# Patient Record
Sex: Male | Born: 1990 | Race: Black or African American | Hispanic: No | Marital: Single | Smoking: Current every day smoker
Health system: Southern US, Community
[De-identification: ages and names within clinical notes are randomized; demographics above are authoritative.]

---
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
## 2008-03-24 ENCOUNTER — Emergency Department (HOSPITAL_COMMUNITY): Admission: EM | Admit: 2008-03-24 | Discharge: 2008-03-25 | Payer: Self-pay | Admitting: Emergency Medicine

## 2010-04-07 ENCOUNTER — Emergency Department (HOSPITAL_COMMUNITY): Admission: EM | Admit: 2010-04-07 | Discharge: 2010-04-07 | Payer: Self-pay | Admitting: Emergency Medicine

## 2011-08-06 LAB — URINALYSIS, ROUTINE W REFLEX MICROSCOPIC
Bilirubin Urine: NEGATIVE
Glucose, UA: NEGATIVE
Hgb urine dipstick: NEGATIVE
Ketones, ur: NEGATIVE
Nitrite: NEGATIVE
Protein, ur: NEGATIVE
Specific Gravity, Urine: 1.02
Urobilinogen, UA: 0.2
pH: 6.5

## 2021-10-13 ENCOUNTER — Emergency Department (HOSPITAL_COMMUNITY): Payer: Self-pay

## 2021-10-13 ENCOUNTER — Emergency Department (HOSPITAL_COMMUNITY)
Admission: EM | Admit: 2021-10-13 | Discharge: 2021-10-14 | Disposition: A | Discharge status: Home / Self Care | Payer: Self-pay | Attending: Emergency Medicine | Admitting: Emergency Medicine

## 2021-10-13 ENCOUNTER — Encounter (HOSPITAL_COMMUNITY): Payer: Self-pay | Admitting: *Deleted

## 2021-10-13 DIAGNOSIS — R059 Cough, unspecified: Secondary | ICD-10-CM | POA: Insufficient documentation

## 2021-10-13 DIAGNOSIS — R0789 Other chest pain: Secondary | ICD-10-CM | POA: Insufficient documentation

## 2021-10-13 DIAGNOSIS — F17210 Nicotine dependence, cigarettes, uncomplicated: Secondary | ICD-10-CM | POA: Insufficient documentation

## 2021-10-13 DIAGNOSIS — R0602 Shortness of breath: Secondary | ICD-10-CM | POA: Insufficient documentation

## 2021-10-13 NOTE — ED Triage Notes (Signed)
Chest pain x1 week

## 2021-10-14 LAB — COMPREHENSIVE METABOLIC PANEL
ALT: 30 U/L (ref 0–44)
AST: 16 U/L (ref 15–41)
Albumin: 3.9 g/dL (ref 3.5–5.0)
Alkaline Phosphatase: 81 U/L (ref 38–126)
Anion gap: 6 (ref 5–15)
BUN: 13 mg/dL (ref 6–20)
CO2: 27 mmol/L (ref 22–32)
Calcium: 8.8 mg/dL — ABNORMAL LOW (ref 8.9–10.3)
Chloride: 105 mmol/L (ref 98–111)
Creatinine, Ser: 1.18 mg/dL (ref 0.61–1.24)
GFR, Estimated: >60 mL/min (ref >60)
Glucose, Bld: 96 mg/dL (ref 70–99)
Potassium: 4.2 mmol/L (ref 3.5–5.1)
Sodium: 138 mmol/L (ref 135–145)
Total Bilirubin: 0.2 mg/dL — ABNORMAL LOW (ref 0.3–1.2)
Total Protein: 7.7 g/dL (ref 6.5–8.1)

## 2021-10-14 LAB — CBC
HCT: 45 % (ref 39.0–52.0)
Hemoglobin: 14.6 g/dL (ref 13.0–17.0)
MCH: 29 pg (ref 26.0–34.0)
MCHC: 32.4 g/dL (ref 30.0–36.0)
MCV: 89.5 fL (ref 80.0–100.0)
Platelets: 319 10*3/uL (ref 150–400)
RBC: 5.03 MIL/uL (ref 4.22–5.81)
RDW: 13.5 % (ref 11.5–15.5)
WBC: 11.1 10*3/uL — ABNORMAL HIGH (ref 4.0–10.5)
nRBC: 0 % (ref 0.0–0.2)

## 2021-10-14 LAB — TROPONIN I (HIGH SENSITIVITY): Troponin I (High Sensitivity): 2 ng/L (ref <18)

## 2021-10-14 MED ORDER — ALBUTEROL SULFATE HFA 108 (90 BASE) MCG/ACT IN AERS: 2.0000 | INHALATION_SPRAY | RESPIRATORY_TRACT | Status: DC | PRN

## 2021-10-14 NOTE — ED Provider Notes (Signed)
[facility] EMERGENCY DEPARTMENT Provider Note   CSN: [id number] Arrival date & time: 10/13/21  1742     History Chief Complaint  Patient presents with   Chest Pain    Chase Larson is a 30 y.o. male.  Patient presents to the emergency department for evaluation of chest pain.  Patient reports that the pain began 1 week ago.  Initially the pain was constant but now it seems to be more intermittent.  He does have some slight cough and has had occasional intermittent shortness of breath.  He has a smoker, is trying to cut back.  No hypertension, high cholesterol, diabetes, family history of early heart disease.   HPI: A 30 year old patient with a history of obesity presents for evaluation of chest pain. Initial onset of pain was more than 6 hours ago. The patient's chest pain is not worse with exertion. The patient's chest pain is middle- or left-sided, is not well-localized, is not described as heaviness/pressure/tightness, is not sharp and does not radiate to the arms/jaw/neck. The patient does not complain of nausea and denies diaphoresis. The patient has smoked in the past 90 days. The patient has no history of stroke, has no history of peripheral artery disease, denies any history of treated diabetes, has no relevant family history of coronary artery disease (first degree relative at less than age 32), is not hypertensive and has no history of hypercholesterolemia.   History reviewed. No pertinent past medical history.  There are no problems to display for this patient.   History reviewed. No pertinent surgical history.     No family history on file.  Social History   Tobacco Use   Smoking status: Every Day    Types: Cigarettes   Smokeless tobacco: Never  Substance Use Topics   Alcohol use: Yes   Drug use: Yes    Types: Marijuana    Home Medications Prior to Admission medications   Not on File    Allergies    Patient has no known allergies.  Review of Systems    Review of Systems  Respiratory:  Positive for cough and shortness of breath.   Cardiovascular:  Positive for chest pain.  All other systems reviewed and are negative.  Physical Exam Updated Vital Signs BP 127/72    Pulse 66    Temp 98.2 F (36.8 C) (Oral)    Resp 20    Ht 6\' 5"  (1.956 m)    Wt (!) 142.9 kg    SpO2 100%    BMI 37.35 kg/m   Physical Exam Vitals and nursing note reviewed.  Constitutional:      General: He is not in acute distress.    Appearance: Normal appearance. He is well-developed.  HENT:     Head: Normocephalic and atraumatic.     Right Ear: Hearing normal.     Left Ear: Hearing normal.     Nose: Nose normal.  Eyes:     Conjunctiva/sclera: Conjunctivae normal.     Pupils: Pupils are equal, round, and reactive to light.  Cardiovascular:     Rate and Rhythm: Regular rhythm.     Heart sounds: S1 normal and S2 normal. No murmur heard.   No friction rub. No gallop.  Pulmonary:     Effort: Pulmonary effort is normal. No respiratory distress.     Breath sounds: Normal breath sounds.  Chest:     Chest wall: No tenderness.  Abdominal:     General: Bowel sounds are normal.  Palpations: Abdomen is soft.     Tenderness: There is no abdominal tenderness. There is no guarding or rebound. Negative signs include Murphy's sign and McBurney's sign.     Hernia: No hernia is present.  Musculoskeletal:        General: Normal range of motion.     Cervical back: Normal range of motion and neck supple.  Skin:    General: Skin is warm and dry.     Findings: No rash.  Neurological:     Mental Status: He is alert and oriented to person, place, and time.     GCS: GCS eye subscore is 4. GCS verbal subscore is 5. GCS motor subscore is 6.     Cranial Nerves: No cranial nerve deficit.     Sensory: No sensory deficit.     Coordination: Coordination normal.  Psychiatric:        Speech: Speech normal.        Behavior: Behavior normal.        Thought Content: Thought content  normal.    ED Results / Procedures / Treatments   Labs (all labs ordered are listed, but only abnormal results are displayed) Labs Reviewed  CBC - Abnormal; Notable for the following components:      Result Value   WBC 11.1 (*)    All other components within normal limits  COMPREHENSIVE METABOLIC PANEL - Abnormal; Notable for the following components:   Calcium 8.8 (*)    Total Bilirubin 0.2 (*)    All other components within normal limits  TROPONIN I (HIGH SENSITIVITY)    EKG EKG Interpretation  Date/Time:  Monday October 13 2021 18:51:08 EST Ventricular Rate:  71 PR Interval:  176 QRS Duration: 90 QT Interval:  368 QTC Calculation: 399 R Axis:   77 Text Interpretation: Normal sinus rhythm T wave abnormality, consider inferior ischemia Abnormal ECG Confirmed by Gilda Crease [phone]) on 10/14/2021 12:16:45 AM  Radiology DG Chest 2 View  Result Date: 10/13/2021 CLINICAL DATA:  Chest pain for 1 week. EXAM: CHEST - 2 VIEW COMPARISON:  Chest x-ray 04/07/2010. FINDINGS: The heart size and mediastinal contours are within normal limits. Both lungs are clear. The visualized skeletal structures are unremarkable. IMPRESSION: No active cardiopulmonary disease. Electronically Signed   By: Darliss Cheney M.D.   On: 10/13/2021 19:28    Procedures Procedures   Medications Ordered in ED Medications - No data to display  ED Course  I have reviewed the triage vital signs and the nursing notes.  Pertinent labs & imaging results that were available during my care of the patient were reviewed by me and considered in my medical decision making (see chart for details).    MDM Rules/Calculators/A&P HEAR Score: 2                       Patient presents to the emergency department for evaluation of chest pain.  Patient is felt to be very low risk for cardiac etiology.  Work-up is reassuring.  He has had pain for 1 week, troponin negative.  EKG with nonspecific changes but no obvious  ischemia or infarct.  Pain is only intermittent now, not related to exertion.  His only risk factors are increased BMI and smoking.  This is felt to be noncardiac in nature, does not require further work-up.     Final Clinical Impression(s) / ED Diagnoses Final diagnoses:  Atypical chest pain    Rx / DC Orders ED  Discharge Orders     None        Gilda Crease, MD 10/14/21 0120

## 2023-05-25 ENCOUNTER — Emergency Department (HOSPITAL_COMMUNITY): Payer: 59

## 2023-05-25 ENCOUNTER — Emergency Department (HOSPITAL_COMMUNITY)
Admission: EM | Admit: 2023-05-25 | Discharge: 2023-05-25 | Disposition: A | Discharge status: Home / Self Care | Payer: 59 | Attending: Emergency Medicine | Admitting: Emergency Medicine

## 2023-05-25 ENCOUNTER — Other Ambulatory Visit: Payer: Self-pay

## 2023-05-25 ENCOUNTER — Encounter (HOSPITAL_COMMUNITY): Payer: Self-pay | Admitting: Pharmacy Technician

## 2023-05-25 DIAGNOSIS — I10 Essential (primary) hypertension: Secondary | ICD-10-CM | POA: Diagnosis not present

## 2023-05-25 DIAGNOSIS — R0781 Pleurodynia: Secondary | ICD-10-CM | POA: Insufficient documentation

## 2023-05-25 DIAGNOSIS — D72829 Elevated white blood cell count, unspecified: Secondary | ICD-10-CM | POA: Diagnosis not present

## 2023-05-25 LAB — CBC WITH DIFFERENTIAL/PLATELET
Abs Immature Granulocytes: 0.05 10*3/uL (ref 0.00–0.07)
Basophils Absolute: 0.1 10*3/uL (ref 0.0–0.1)
Basophils Relative: 0 %
Eosinophils Absolute: 0.1 10*3/uL (ref 0.0–0.5)
Eosinophils Relative: 1 %
HCT: 49.1 % (ref 39.0–52.0)
Hemoglobin: 15.9 g/dL (ref 13.0–17.0)
Immature Granulocytes: 0 %
Lymphocytes Relative: 31 %
Lymphs Abs: 4.2 10*3/uL — ABNORMAL HIGH (ref 0.7–4.0)
MCH: 28.4 pg (ref 26.0–34.0)
MCHC: 32.4 g/dL (ref 30.0–36.0)
MCV: 87.8 fL (ref 80.0–100.0)
Monocytes Absolute: 0.6 10*3/uL (ref 0.1–1.0)
Monocytes Relative: 5 %
Neutro Abs: 8.4 10*3/uL — ABNORMAL HIGH (ref 1.7–7.7)
Neutrophils Relative %: 63 %
Platelets: 331 10*3/uL (ref 150–400)
RBC: 5.59 MIL/uL (ref 4.22–5.81)
RDW: 14.2 % (ref 11.5–15.5)
WBC: 13.4 10*3/uL — ABNORMAL HIGH (ref 4.0–10.5)
nRBC: 0 % (ref 0.0–0.2)

## 2023-05-25 LAB — COMPREHENSIVE METABOLIC PANEL
ALT: 29 U/L (ref 0–44)
AST: 15 U/L (ref 15–41)
Albumin: 3.6 g/dL (ref 3.5–5.0)
Alkaline Phosphatase: 81 U/L (ref 38–126)
Anion gap: 5 (ref 5–15)
BUN: 11 mg/dL (ref 6–20)
CO2: 28 mmol/L (ref 22–32)
Calcium: 8.8 mg/dL — ABNORMAL LOW (ref 8.9–10.3)
Chloride: 102 mmol/L (ref 98–111)
Creatinine, Ser: 1.05 mg/dL (ref 0.61–1.24)
GFR, Estimated: >60 mL/min (ref >60)
Glucose, Bld: 97 mg/dL (ref 70–99)
Potassium: 4 mmol/L (ref 3.5–5.1)
Sodium: 135 mmol/L (ref 135–145)
Total Bilirubin: 0.4 mg/dL (ref 0.3–1.2)
Total Protein: 7.5 g/dL (ref 6.5–8.1)

## 2023-05-25 LAB — D-DIMER, QUANTITATIVE: D-Dimer, Quant: 0.28 ug/mL-FEU (ref 0.00–0.50)

## 2023-05-25 MED ORDER — HYDROCODONE-ACETAMINOPHEN 5-325 MG PO TABS
1.0000 | ORAL_TABLET | Freq: Once | ORAL | Status: AC
  Administered 2023-05-25: 1 via ORAL
  Filled 2023-05-25: qty 1

## 2023-05-25 MED ORDER — HYDROCODONE-ACETAMINOPHEN 5-325 MG PO TABS: ORAL_TABLET | ORAL | 0 refills | Status: AC

## 2023-05-25 MED ORDER — IBUPROFEN 800 MG PO TABS
800.0000 mg | ORAL_TABLET | Freq: Once | ORAL | Status: AC
  Administered 2023-05-25: 800 mg via ORAL
  Filled 2023-05-25: qty 1

## 2023-05-25 MED ORDER — AMLODIPINE BESYLATE 5 MG PO TABS: 10.0000 mg | ORAL_TABLET | Freq: Every day | ORAL | 0 refills | Status: AC

## 2023-05-25 NOTE — ED Provider Notes (Signed)
[facility] EMERGENCY DEPARTMENT AT [facility] Provider Note   CSN: [id number] Arrival date & time: 05/25/23  4098     History  Chief Complaint  Patient presents with   Abdominal Pain    Chase Larson is a 32 y.o. male.   Abdominal Pain Associated symptoms: chest pain (left anterior rib pain)   Associated symptoms: no chills, no cough, no dysuria, no fever, no nausea, no shortness of breath and no vomiting        Chase Larson is a 32 y.o. male who presents to the Emergency Department complaining of persistent left anterior rib.  Pain has been persistent for 1 month.  He was seen previously at urgent care who prescribed muscle relaxers and a course of his own without relief.  He describes pain to the area that is localized and worsens with movement coughing bending sneezing or laughing.  Pain also associated with deep breathing.  He denies feeling short of breath or pain of his upper chest.  No back pain dysuria or flank pain.  He also denies any abdominal pain, pain worsens with eating, nausea, vomiting, fever or chills.  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  Home Medications Prior to Admission medications   Not on File      Allergies    Patient has no known allergies.    Review of Systems   Review of Systems  Constitutional:  Negative for appetite change, chills and fever.  Respiratory:  Negative for cough and shortness of breath.   Cardiovascular:  Positive for chest pain (left anterior rib pain).  Gastrointestinal:  Positive for abdominal pain. Negative for nausea and vomiting.  Genitourinary:  Negative for difficulty urinating, dysuria and flank pain.  Musculoskeletal:  Negative for back pain and neck pain.  Skin:  Negative for rash.  Neurological:  Negative for dizziness, weakness and headaches.    Physical Exam Updated Vital Signs BP (!) 185/86   Pulse 87   Temp (!) 97.5 F (36.4 C)   Resp 18    SpO2 95%  Physical Exam Vitals and nursing note reviewed.  Constitutional:      General: He is not in acute distress.    Appearance: He is well-developed. He is not ill-appearing or toxic-appearing.  Cardiovascular:     Rate and Rhythm: Normal rate and regular rhythm.     Pulses: Normal pulses.  Pulmonary:     Effort: Pulmonary effort is normal.     Comments: Localized tenderness to palpation anterior left lower rib.  No bony deformity or crepitus. Chest:     Chest wall: Tenderness present.  Abdominal:     General: Abdomen is flat.     Palpations: Abdomen is soft.     Tenderness: There is no abdominal tenderness.  Musculoskeletal:        General: Normal range of motion.  Skin:    General: Skin is warm.     Capillary Refill: Capillary refill takes less than 2 seconds.  Neurological:     General: No focal deficit present.     Mental Status: He is alert.     Sensory: No sensory deficit.     Motor: No weakness.     ED Results / Procedures / Treatments   Labs (all labs ordered are listed, but only abnormal results are displayed) Labs Reviewed  CBC WITH DIFFERENTIAL/PLATELET - Abnormal; Notable for the following components:  Result Value   WBC 13.4 (*)    Neutro Abs 8.4 (*)    Lymphs Abs 4.2 (*)    All other components within normal limits  COMPREHENSIVE METABOLIC PANEL - Abnormal; Notable for the following components:   Calcium 8.8 (*)    All other components within normal limits  D-DIMER, QUANTITATIVE    EKG EKG Interpretation Date/Time:  Tuesday May 25 2023 10:06:47 EDT Ventricular Rate:  72 PR Interval:  183 QRS Duration:  89 QT Interval:  379 QTC Calculation: 415 R Axis:   48  Text Interpretation: Sinus rhythm ST-t wave abnormality No significant change since last tracing  2022 Abnormal ECG Confirmed by Gerhard Munch [phone]) on 05/25/2023 10:21:23 AM  Radiology DG Ribs Unilateral W/Chest Left  Result Date: 05/25/2023 CLINICAL DATA:  Left-sided rib  pain EXAM: LEFT RIBS AND CHEST - 5 VIEW COMPARISON:  X-ray 10/13/2021 FINDINGS: No consolidation, pneumothorax or effusion. No edema. Normal cardiopericardial silhouette. No rib fracture identified. IMPRESSION: No rib fracture seen.  No pneumothorax or effusion Electronically Signed   By: Karen Kays M.D.   On: 05/25/2023 10:08    Procedures Procedures    Medications Ordered in ED Medications  HYDROcodone-acetaminophen (NORCO/VICODIN) 5-325 MG per tablet 1 tablet (has no administration in time range)  ibuprofen (ADVIL) tablet 800 mg (has no administration in time range)    ED Course/ Medical Decision Making/ A&P                                 Medical Decision Making Patient here with history of persistent left anterior rib pain.  Pain associated with position change, coughing, bending, sneezing or laughing.  Pain also present with deep breathing.  He denies feeling any shortness of breath or pain of his upper chest.  He was previously treated with prednisone and muscle relaxer without improvement.  Denies persistent cough fever or chills.  No abdominal pain nausea vomiting or diarrhea.  No flank pain or dysuria.  Differential would include but not limited to musculoskeletal pain, rib fracture, PE, pneumonia, ACS  Amount and/or Complexity of Data Reviewed Labs: ordered.    Details: Labs show mild leukocytosis with white count of 13,400.  D-dimer unremarkable chemistries without derangement Radiology: ordered.    Details: Chest x-ray with out evidence of rib fracture or pneumothorax or effusion. ECG/medicine tests: ordered.    Details: EKG shows sinus rhythm with ST and T wave abnormalities but no significant change since last tracing of 2022 Discussion of management or test interpretation with external provider(s): Patient with focal left rib pain, suspect musculoskeletal.  His D-dimer was reassuring.  No specific EKG changes.  He is hypertensive here with several risk factors.  Will  have him start low-dose amlodipine and start blood pressure log.  Given resource list for local primary care so that he may establish care and have his blood pressure rechecked.  Symptomatic treatment for rib pain.  He appears appropriate for discharge home.  Return precautions were discussed.  Risk Prescription drug management.           Final Clinical Impression(s) / ED Diagnoses Final diagnoses:  Rib pain  Hypertension, unspecified type    Rx / DC Orders ED Discharge Orders     None         Pauline Aus, PA-C 05/25/23 1213    Gerhard Munch, MD 05/26/23 [phone]

## 2023-05-25 NOTE — Discharge Instructions (Signed)
You have been started on a medication for your blood pressure.  It is important that you take the medication every day and try to take at the same time each day.  Record your blood pressure readings daily and take this with you when you see your primary care provider.  You can apply heating pad or ice pack on and off to your left chest area.  Avoid heavy lifting reaching pulling or twisting movements for at least 1-2 weeks.  Return emergency department for any new or worsening symptoms.

## 2023-05-25 NOTE — ED Triage Notes (Signed)
Pt here with complaints of LUQ abdominal pain X1 month. States was given steroids and muscle relaxants without improvement in symptoms. Sudden worsening of pain  yesterday. Denies fevers, NVD.

## 2023-06-10 ENCOUNTER — Ambulatory Visit: Payer: 59 | Admitting: Internal Medicine

## 2023-07-13 IMAGING — DX DG CHEST 2V
2 series · 2 of 2 positions shown · non-contrast
Comparison: Chest x-ray 04/07/2010.

CLINICAL DATA: Chest pain for 1 week.

EXAM:
CHEST - 2 VIEW

[chest pa]
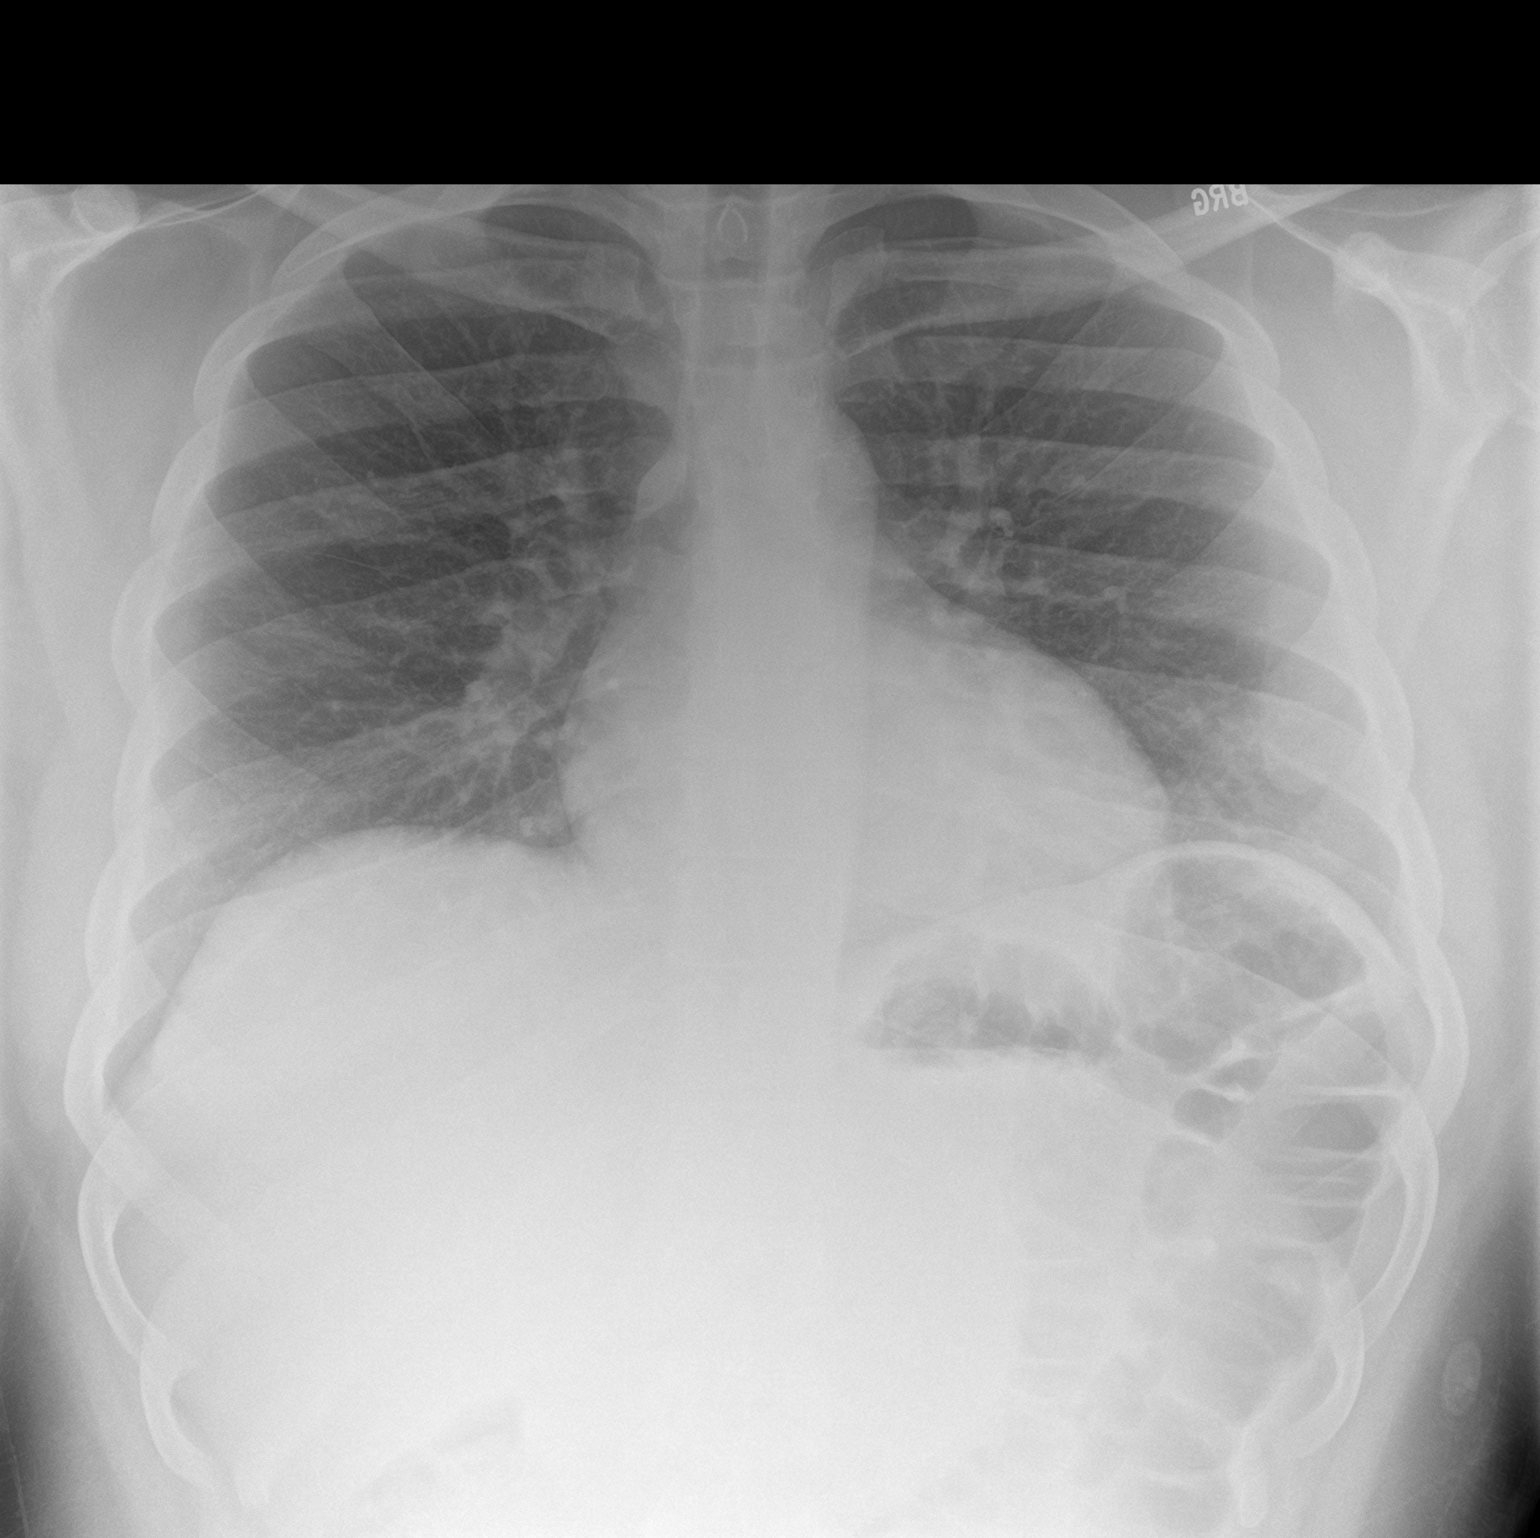

[chest lat]
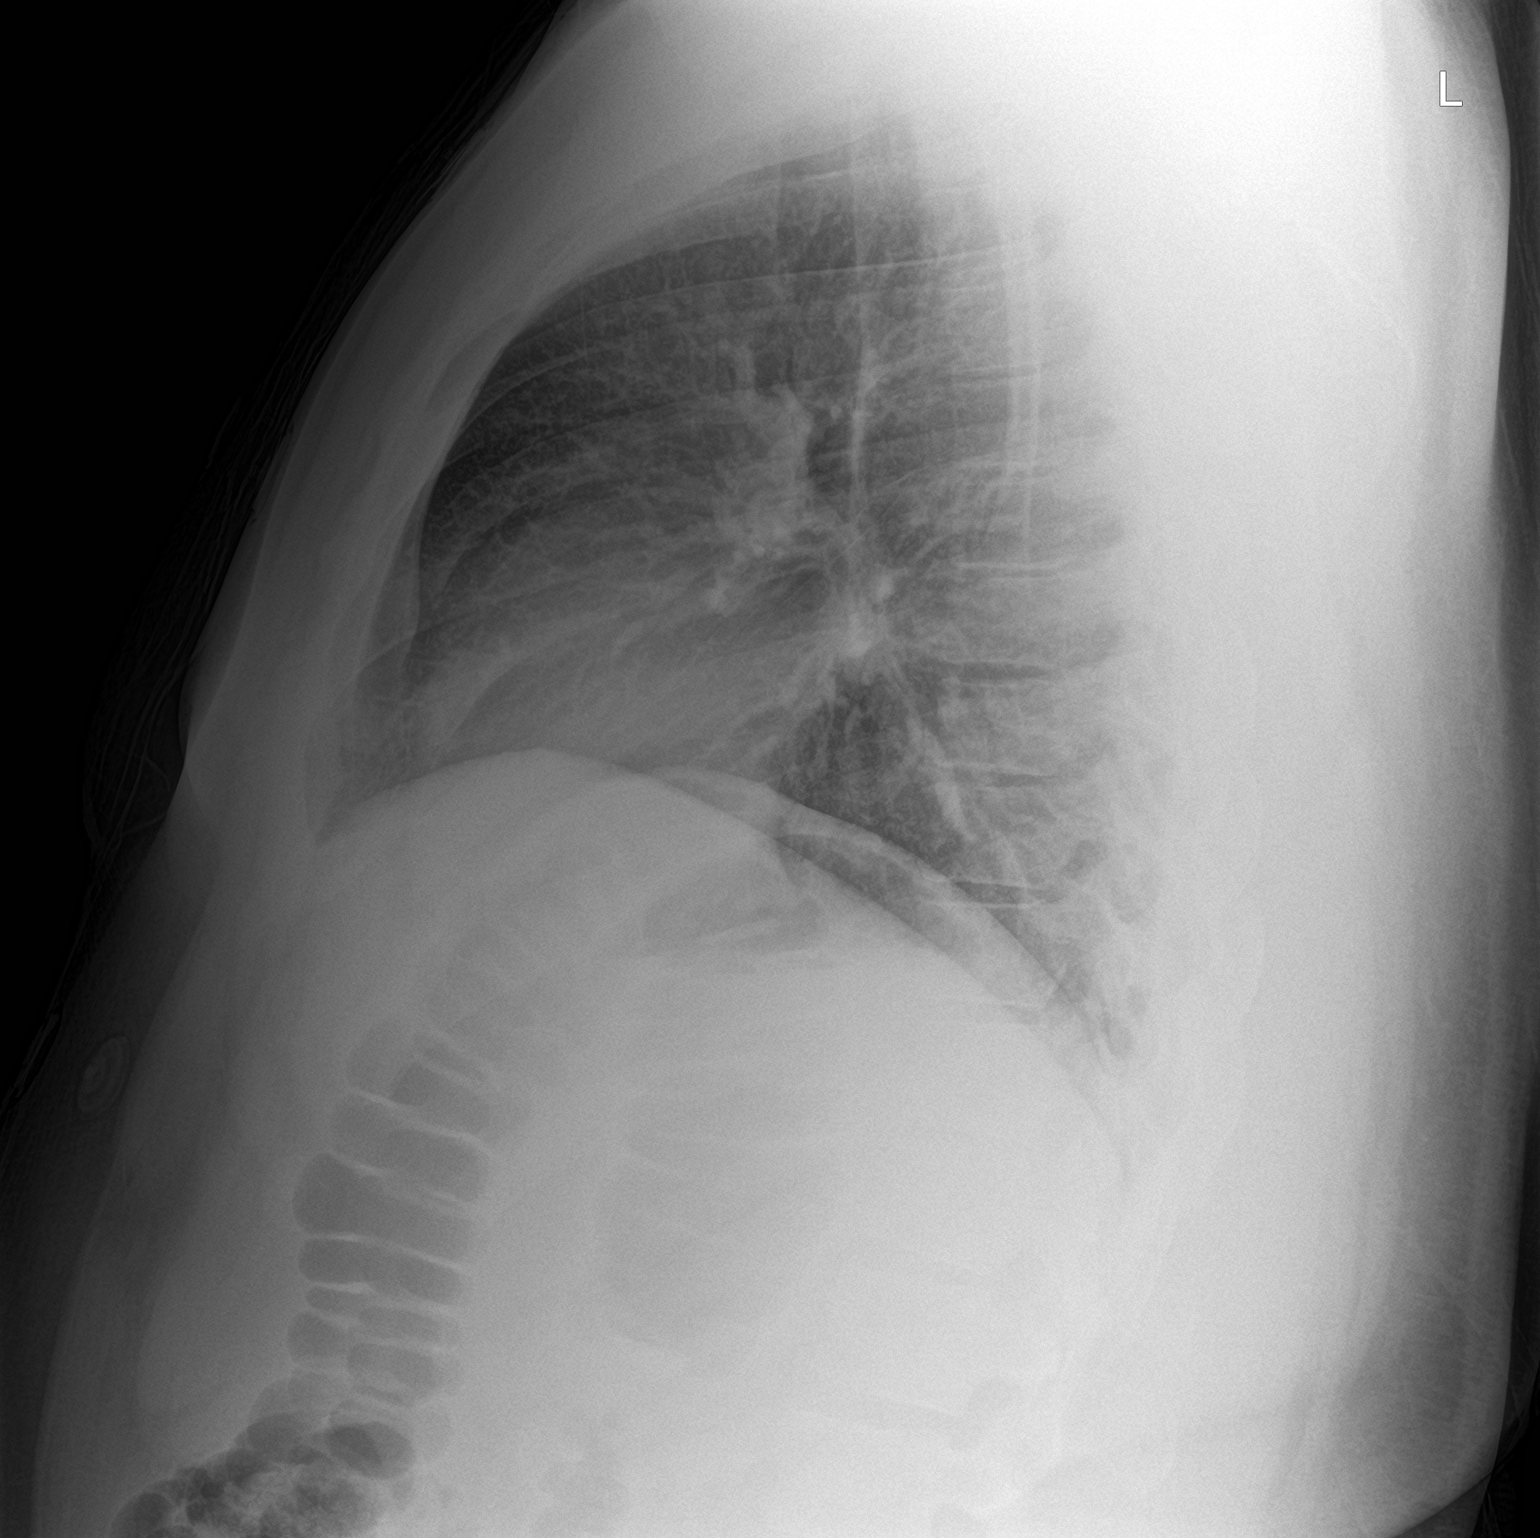

[2 of 2 positions shown; findings below may reference images not displayed]

FINDINGS: The heart size and mediastinal contours are within normal limits.
Both lungs are clear. The visualized skeletal structures are
unremarkable.
IMPRESSION: No active cardiopulmonary disease.
